# Patient Record
Sex: Female | Born: 1980 | Race: Black or African American | Hispanic: No | Marital: Single | Smoking: Current every day smoker
Health system: Southern US, Community
[De-identification: ages and names within clinical notes are randomized; demographics above are authoritative.]

## PROBLEM LIST (undated history)

## (undated) DIAGNOSIS — I10 Essential (primary) hypertension: Secondary | ICD-10-CM

## (undated) HISTORY — DX: Essential (primary) hypertension: I10

---

## 2001-12-13 ENCOUNTER — Other Ambulatory Visit: Admission: RE | Admit: 2001-12-13 | Discharge: 2001-12-13 | Payer: Self-pay | Admitting: Obstetrics and Gynecology

## 2002-02-07 ENCOUNTER — Inpatient Hospital Stay (HOSPITAL_COMMUNITY): Admission: AD | Admit: 2002-02-07 | Discharge: 2002-02-09 | Payer: Self-pay | Admitting: Obstetrics and Gynecology

## 2009-08-16 ENCOUNTER — Emergency Department (HOSPITAL_COMMUNITY): Admission: EM | Admit: 2009-08-16 | Discharge: 2009-08-16 | Payer: Self-pay | Admitting: Emergency Medicine

## 2010-12-22 LAB — URINALYSIS, ROUTINE W REFLEX MICROSCOPIC
Ketones, ur: NEGATIVE mg/dL
Leukocytes, UA: NEGATIVE
Nitrite: NEGATIVE
Protein, ur: NEGATIVE mg/dL
Urobilinogen, UA: 0.2 mg/dL (ref 0.0–1.0)
pH: 6 (ref 5.0–8.0)

## 2010-12-22 LAB — PREGNANCY, URINE: Preg Test, Ur: NEGATIVE

## 2011-02-04 NOTE — Op Note (Signed)
[facility]  Patient:    Ann Zuniga, Ann Zuniga Visit Number: [id number] MRN: [id number]          Service Type: OBS Location: 4A A416 01 Attending Physician:  Tilda Burrow Dictated by:   Pat Kocher, CNM Proc. Date: 02/07/02 Admit Date:  02/07/2002 Discharge Date: 02/09/2002   CC:         [facility]  J. Joneen Caraway, M.D.   Operative Report  DELIVERY NOTE  [name] became fully dilated with an urge to push at approximately 12:00. She pushed steadily and effectively and delivered a viable female infant at 12:49. Mouth and nose were suctioned on the perineum. Respirations were spontaneous with a lusty cry. Apgar 8 and 9, weight 4 pounds, 3 ounces. Of note, there is a severe amount of molding of the fetal head leading me the believe that [name] would not be adequate for a full-term size infant in the future. The 20 units of Pitocin diluted in a 1000 cc of lactated Ringers was begun infusing rapidly after a large gush of blood and several fist-size clots with partial separation of the placenta. The placenta then completed separation and delivered spontaneously approximately 5 minutes later. The placenta was inspected and appears to be intact with a succenturiate lobe. It is being sent to pathology. The uterus became atonic and required bimanual compression to firm it back up. The patient received 125 mcg of Hemabate IM with good response. However, there were noted to be several large blood clots in the lower uterine segment which were removed manually after the patient received a 1/2 mg of Stadol IV. She tolerated the procedure well. The fundus is now quite firm with minimal blood loss noted. The vagina was inspected and is noted to be intact. Immediate postpartum blood pressure was 172/96 with a normal pulse. [name] will be watched closely regarding her blood pressure. Estimated blood loss 450 cc. Dictated by:   Pat Kocher, CNM Attending Physician:  Tilda Burrow DD:  02/07/02 TD:  02/10/02 Job: [id number]

## 2011-02-04 NOTE — H&P (Signed)
[facility]  Patient:    Ann Zuniga, Ann Zuniga Visit Number: [id number] MRN: [id number]          Service Type: OBS Location: 4A A416 01 Attending Physician:  Tilda Burrow Dictated by:   Estelle June, C.N.M. Admit Date:  02/07/2002                           History and Physical  CHIEF COMPLAINT:  Contractions beginning at 7 a.m., increasing dramatically in frequency and intensity over the past 30 minutes.  HISTORY OF PRESENT ILLNESS:  Ann Zuniga is a 30 year old black female who began prenatal care late at approximately 27 weeks.  Her last menstrual period, April 26, 2001, placed her [facility] at Jan 31, 2002, but her [facility] was corrected with an ultrasound that showed her to be 28-5/7 weeks on December 20, 2001, placing her [facility] at March 09, 2002, placing her at 35 weeks and 2 days gestation.  She has had a total of three visits to the [facility] with the last two visits unkept.  The patient states that she smokes marijuana in order to help her appetite, which apparently it did, saying that she had a documented 18-pound weight gain in three weeks.  Fundal height measurements were normal for gestational age.  PRENATAL LABORATORY DATA:  Blood type O+.  Rubella immune.  The H&H was 10.9 and 33.4.  HIV is nonreactive.  The Pap smear was normal.  Gonorrhea, chlamydia, and group B streptococcus were negative at 27 weeks.  The urine drug screen was positive for marijuana.  PAST MEDICAL HISTORY:  Noncontributory.  PAST SURGICAL HISTORY:  Noncontributory.  No history of blood transfusions or anesthesia complications.  ALLERGIES:  No known drug allergies.  MEDICATIONS:  Iron and prenatal vitamins.  SOCIAL HISTORY:  Single and employed.  High school dropout.  Not using birth control.  The father of the baby, Richard Holz, is present and semi-supportive.  She smokes about a third of a pack of cigarettes a day and admits to marijuana use as stated  above.  GYNECOLOGICAL HISTORY:  The patient is a gravida 1, para 0.  FAMILY HISTORY:  Positive for hypertension, cancer, tuberculosis many years ago, and diabetes.  PHYSICAL EXAMINATION:  HEENT within normal limits.  HEART:  Regular rate and rhythm without murmurs.  LUNGS:  Clear to auscultation bilaterally.  BREASTS:  Exam deferred.  ABDOMEN:  Soft and nontender, having strong contractions every one to one-and-a-half minutes.  The fetal heart rate is in the 120s-130s with average long-term variability.  Accelerations present.  Occasional variable decelerations.  PELVIC:  On 45-minute scope per R.N. exam, the cervix was 3 cm, 100% effaced, and 0 station.  Now she is 8-9, +1 station, bulging membranes, and 100% effaced.  Artificial rupture of membranes reveals clear fluids.  EXTREMITIES:  The legs are negative.  IMPRESSION:  Intrauterine pregnancy at 36 weeks and 2 days in active labor with reassuring fetal heart rate.  PLAN:  Ampicillin for GBS prophylaxis due to preterm labor despite a negative culture eight weeks ago.  Duane Lope, M.D., is aware of the patient and is present on the maternity unit.  Expect vaginal delivery. Dictated by:   Estelle June, C.N.M. Attending Physician:  Tilda Burrow DD:  02/07/02 TD:  02/07/02 Job: [id number]

## 2011-03-27 ENCOUNTER — Emergency Department (HOSPITAL_COMMUNITY)
Admission: EM | Admit: 2011-03-27 | Discharge: 2011-03-27 | Disposition: A | Discharge status: Home / Self Care | Payer: Self-pay | Attending: Emergency Medicine | Admitting: Emergency Medicine

## 2011-03-27 ENCOUNTER — Other Ambulatory Visit: Payer: Self-pay

## 2011-03-27 ENCOUNTER — Encounter: Payer: Self-pay | Admitting: *Deleted

## 2011-03-27 DIAGNOSIS — K219 Gastro-esophageal reflux disease without esophagitis: Secondary | ICD-10-CM | POA: Insufficient documentation

## 2011-03-27 MED ORDER — FAMOTIDINE 20 MG PO TABS: 20.0000 mg | ORAL_TABLET | Freq: Two times a day (BID) | ORAL | Status: DC

## 2011-03-27 MED ORDER — GI COCKTAIL ~~LOC~~
30.0000 mL | Freq: Once | ORAL | Status: AC
  Administered 2011-03-27: 30 mL via ORAL
  Filled 2011-03-27: qty 30

## 2011-03-27 NOTE — ED Notes (Signed)
Gi cocktail given. Nad. Rating pain 6 prior to med.

## 2011-03-27 NOTE — ED Notes (Signed)
States feels like a "ball is in my chest" with burning, difficulty swallowing x 3 days.  Denies n/v.

## 2011-03-27 NOTE — ED Notes (Signed)
No change. Awaiting d/c papers. nad

## 2011-03-27 NOTE — ED Notes (Addendum)
Pt states pain is gone now. Nad. Pa aware

## 2011-03-27 NOTE — ED Provider Notes (Signed)
History     Chief Complaint  Patient presents with  . Gastrophageal Reflux   HPI Comments: Patient c/o burning sensation to her chest since yesterday.  Symptoms worsen with drinking or eating.   HAs hx of similarr sx's  Patient is a 30 y.o. female presenting with GERD. The history is provided by the patient.  Gastrophageal Reflux This is a recurrent problem. The current episode started today. The problem occurs constantly. The problem has been unchanged. Associated symptoms include chest pain. Pertinent negatives include no abdominal pain, coughing, fever, nausea, neck pain or vomiting. Associated symptoms comments: Burning . The symptoms are aggravated by swallowing, eating and drinking. She has tried nothing for the symptoms. The treatment provided no relief.    History reviewed. No pertinent past medical history.  History reviewed. No pertinent past surgical history.  History reviewed. No pertinent family history.  History  Substance Use Topics  . Smoking status: Never Smoker   . Smokeless tobacco: Not on file  . Alcohol Use: No    OB History    Grav Para Term Preterm Abortions TAB SAB Ect Mult Living                  Review of Systems  Constitutional: Negative.  Negative for fever.  HENT: Negative.  Negative for neck pain.   Respiratory: Negative.  Negative for cough, chest tightness and shortness of breath.   Cardiovascular: Positive for chest pain.  Gastrointestinal: Negative for nausea, vomiting, abdominal pain and abdominal distention.  Musculoskeletal: Negative.   Skin: Negative.   Neurological: Negative.     Physical Exam  BP 147/92  Pulse 65  Temp(Src) 97.8 F (36.6 C) (Oral)  Resp 18  Ht 5\' 3"  (1.6 m)  Wt 175 lb (79.379 kg)  BMI 31.00 kg/m2  SpO2 98%  LMP 03/21/2011  Physical Exam  Constitutional: She is oriented to person, place, and time. Vital signs are normal. She appears well-developed and well-nourished.  Non-toxic appearance.  Eyes: EOM are  normal. Pupils are equal, round, and reactive to light.  Neck: Normal range of motion. Neck supple.  Cardiovascular: Normal rate, regular rhythm and normal heart sounds.   Pulmonary/Chest: Breath sounds normal.  Abdominal: Soft. She exhibits no distension. There is no tenderness. There is no rebound and no guarding.  Musculoskeletal: Normal range of motion.  Neurological: She is alert and oriented to person, place, and time.  Skin: Skin is warm and dry.  Psychiatric: She has a normal mood and affect.    ED Course  Procedures  MDM   Date: 03/07/2011   Rate: 49  Rhythm: sinus bradycardia  QRS Axis: normal  Intervals: normal  ST/T Wave abnormalities: early repolarization  Conduction Disutrbances:none  Narrative Interpretation:   Old EKG Reviewed: none available    PAtient feeling better.  Symptoms are improved.    Tammy L. [place], [place] 03/27/11 1403  Evaluation and management were performed by NP/PA under my supervision/collaboration.  SHELDON,CHARLES B.

## 2018-03-05 ENCOUNTER — Ambulatory Visit: Payer: Self-pay | Admitting: Physician Assistant

## 2018-03-13 ENCOUNTER — Encounter: Payer: Self-pay | Admitting: Physician Assistant

## 2018-06-13 ENCOUNTER — Emergency Department (HOSPITAL_COMMUNITY): Payer: Self-pay

## 2018-06-13 ENCOUNTER — Other Ambulatory Visit: Payer: Self-pay

## 2018-06-13 ENCOUNTER — Encounter (HOSPITAL_COMMUNITY): Payer: Self-pay

## 2018-06-13 ENCOUNTER — Emergency Department (HOSPITAL_COMMUNITY)
Admission: EM | Admit: 2018-06-13 | Discharge: 2018-06-13 | Disposition: A | Discharge status: Home / Self Care | Payer: Self-pay | Attending: Emergency Medicine | Admitting: Emergency Medicine

## 2018-06-13 DIAGNOSIS — I10 Essential (primary) hypertension: Secondary | ICD-10-CM | POA: Insufficient documentation

## 2018-06-13 DIAGNOSIS — H6121 Impacted cerumen, right ear: Secondary | ICD-10-CM | POA: Insufficient documentation

## 2018-06-13 DIAGNOSIS — J069 Acute upper respiratory infection, unspecified: Secondary | ICD-10-CM | POA: Insufficient documentation

## 2018-06-13 MED ORDER — ALBUTEROL SULFATE HFA 108 (90 BASE) MCG/ACT IN AERS
2.0000 | INHALATION_SPRAY | Freq: Four times a day (QID) | RESPIRATORY_TRACT | Status: DC
  Administered 2018-06-13: 2 via RESPIRATORY_TRACT
  Filled 2018-06-13: qty 6.7

## 2018-06-13 MED ORDER — BENZONATATE 100 MG PO CAPS: 100.0000 mg | ORAL_CAPSULE | Freq: Three times a day (TID) | ORAL | 0 refills | Status: DC

## 2018-06-13 MED ORDER — CARBAMIDE PEROXIDE 6.5 % OT SOLN: 5.0000 [drp] | Freq: Two times a day (BID) | OTIC | 0 refills | Status: AC

## 2018-06-13 MED ORDER — FLUTICASONE PROPIONATE 50 MCG/ACT NA SUSP: 2.0000 | Freq: Every day | NASAL | 0 refills | Status: DC

## 2018-06-13 MED ORDER — GUAIFENESIN ER 600 MG PO TB12: 600.0000 mg | ORAL_TABLET | Freq: Two times a day (BID) | ORAL | 0 refills | Status: DC

## 2018-06-13 NOTE — ED Provider Notes (Signed)
[facility] EMERGENCY DEPARTMENT Provider Note   CSN: [id number] Arrival date & time: 06/13/18  1007     History   Chief Complaint Chief Complaint  Patient presents with  . Cough    HPI Ann Zuniga is a 37 y.o. female.  HPI   37 year old female presented emergency department today for evaluation of rhinorrhea, nasal congestion, sinus pressure, sore throat, cough.  Denies chest pain or shortness of breath.  Denies exacerbating or alleviating factors.  Has not tried any medications for her symptoms.  Denies fevers or chills.  States she is tobacco.  History reviewed. No pertinent past medical history.  There are no active problems to display for this patient.   History reviewed. No pertinent surgical history.   OB History   None      Home Medications    Prior to Admission medications   Medication Sig Start Date End Date Taking? Authorizing Provider  benzonatate (TESSALON) 100 MG capsule Take 1 capsule (100 mg total) by mouth every 8 (eight) hours. 06/13/18   Jalayiah Bibian S, PA-C  carbamide peroxide (DEBROX) 6.5 % OTIC solution Place 5 drops into the right ear 2 (two) times daily for 4 days. 06/13/18 06/17/18  Raysa Bosak S, PA-C  fluticasone (FLONASE) 50 MCG/ACT nasal spray Place 2 sprays into both nostrils daily. 06/13/18   Mendel Binsfeld S, PA-C  guaiFENesin (MUCINEX) 600 MG 12 hr tablet Take 1 tablet (600 mg total) by mouth 2 (two) times daily. 06/13/18   Kinsie Belford S, PA-C    Family History No family history on file.  Social History Social History   Tobacco Use  . Smoking status: Never Smoker  . Smokeless tobacco: Never Used  Substance Use Topics  . Alcohol use: Never    Frequency: Never  . Drug use: Never     Allergies   Bee venom   Review of Systems Review of Systems  Constitutional: Negative for fever.  HENT: Positive for congestion, ear pain, postnasal drip, rhinorrhea, sinus pain and sore throat.   Eyes: Negative for visual  disturbance.  Respiratory: Negative for shortness of breath.   Cardiovascular: Negative for chest pain.  Gastrointestinal: Negative for abdominal pain, nausea and vomiting.  Genitourinary: Negative for pelvic pain.  Musculoskeletal: Negative for back pain and myalgias.  Neurological: Negative for headaches.    Physical Exam Updated Vital Signs BP (!) 183/107 (BP Location: Left Arm)   Pulse 82   Temp 98.6 F (37 C) (Oral)   Resp 18   Ht 5\' 3"  (1.6 m)   Wt 83 kg   LMP 05/30/2018   SpO2 96%   BMI 32.42 kg/m   Physical Exam  Constitutional: She appears well-developed and well-nourished. No distress.  HENT:  Head: Normocephalic and atraumatic.  Bilateral TMs without erythema or effusion.  Nasal turbinates swollen bilaterally.  No pharyngeal erythema, tonsillar swelling or exudates.  Moist membranes.  Eyes: Pupils are equal, round, and reactive to light. Conjunctivae and EOM are normal.  Neck: Neck supple.  Cardiovascular: Normal rate, regular rhythm and normal heart sounds.  No murmur heard. Pulmonary/Chest: Effort normal. No stridor. No respiratory distress. She has wheezes. She has no rales.  Abdominal: Soft. Bowel sounds are normal. She exhibits no distension. There is no tenderness. There is no guarding.  Musculoskeletal: She exhibits no edema.  Lymphadenopathy:    She has no cervical adenopathy.  Neurological: She is alert.  Skin: Skin is warm and dry.  Psychiatric: She has a normal mood and  affect.  Nursing note and vitals reviewed.   ED Treatments / Results  Labs (all labs ordered are listed, but only abnormal results are displayed) Labs Reviewed - No data to display  EKG None  Radiology Dg Chest 2 View  Result Date: 06/13/2018 CLINICAL DATA:  Cough and short of breath for 1 day EXAM: CHEST - 2 VIEW COMPARISON:  None. FINDINGS: Normal heart size. Clear lungs. No pneumothorax or pleural effusion. Mild dextroscoliosis of the midthoracic spine. IMPRESSION: No  active cardiopulmonary disease.  Thoracic scoliosis. Electronically Signed   By: Jolaine Click M.D.   On: 06/13/2018 11:05    Procedures Procedures (including critical care time)  Medications Ordered in ED Medications  albuterol (PROVENTIL HFA;VENTOLIN HFA) 108 (90 Base) MCG/ACT inhaler 2 puff (has no administration in time range)     Initial Impression / Assessment and Plan / ED Course  I have reviewed the triage vital signs and the nursing notes.  Pertinent labs & imaging results that were available during my care of the patient were reviewed by me and considered in my medical decision making (see chart for details).     Final Clinical Impressions(s) / ED Diagnoses   Final diagnoses:  Hypertension, unspecified type  Upper respiratory tract infection, unspecified type  Impacted cerumen of right ear   Pt CXR negative for acute infiltrate. Patients symptoms are consistent with URI, likely viral etiology. Discussed that antibiotics are not indicated for viral infections. Pt will be discharged with symptomatic treatment.  Verbalizes understanding and is agreeable with plan. Pt is hemodynamically stable & in NAD prior to dc.  Patient's blood pressure elevated in the emergency department today. Patient denies headache, change in vision, numbness, weakness, chest pain, dyspnea, dizziness, or lightheadedness therefore doubt hypertensive emergency. Discussed elevated blood pressure with the patient and the need for primary care follow up with potential need to initiate or change antihypertensive medications and or for further evaluation. Discussed return precaution signs/symptoms for hypertensive emergency as listed above with the patient. He/she confirmed understanding.     ED Discharge Orders         Ordered    benzonatate (TESSALON) 100 MG capsule  Every 8 hours     06/13/18 1244    carbamide peroxide (DEBROX) 6.5 % OTIC solution  2 times daily     06/13/18 1244    fluticasone  (FLONASE) 50 MCG/ACT nasal spray  Daily     06/13/18 1244    guaiFENesin (MUCINEX) 600 MG 12 hr tablet  2 times daily     06/13/18 1244           Gaile Allmon S, PA-C 06/13/18 1248    Benjiman Core, MD 06/13/18 [phone]

## 2018-06-13 NOTE — Discharge Instructions (Addendum)
If you were given a prescription, please take the prescription as you were instructed and follow the directions given on the discharge paperwork.  ° °Over the next several days you should rest as much as possible, and drink more fluids than usual. Liquids will help thin and loosen mucus so you can cough it up. Liquids will also help prevent dehydration. Using a cool mist humidifier or a vaporizer to increase air moisture in your home can also make it easier for you to breathe and help decrease your cough. ° °To help soothe a sore throat gargle with warm salt water.  Make salt water by dissolving ¼ teaspoon salt in 1 cup warm water. You may also use throat lozenges and over the counter sore throat spray. ° °Please follow up with your primary care provider within 5-7 days for re-evaluation of your symptoms. If you do not have a primary care provider, information for a healthcare clinic has been provided for you to make arrangements for follow up care. Please return to the emergency department for any persistent fevers, worsening sore throat/hoarse voice, inability to swallow, persistent vomiting, chest pain, shortness of breath, coughing up blood, or any new or worsening symptoms. ° °You were noted to have high blood pressure today during your visit in the emergency department. You will need to follow up with your primary healthcare provider to have your blood pressure rechecked as you may need to be started on medication for this if it remains elevated. If you experience any chest pain, shortness of breath, headaches, vision changes, numbness, weakness, lightheadedness, changes in mental status, or decrease in urination you should return to the emergency department immediately.  ° ° °

## 2018-06-13 NOTE — ED Triage Notes (Addendum)
Pt reports chills and cough since yesterday.  Denies fever.    Reports 1 episode of diarrhea today.  No n/v.

## 2018-06-14 ENCOUNTER — Encounter: Payer: Self-pay | Admitting: *Deleted

## 2018-09-08 ENCOUNTER — Emergency Department (HOSPITAL_COMMUNITY): Payer: Self-pay

## 2018-09-08 ENCOUNTER — Emergency Department (HOSPITAL_COMMUNITY)
Admission: EM | Admit: 2018-09-08 | Discharge: 2018-09-08 | Disposition: A | Discharge status: Home / Self Care | Payer: Self-pay | Attending: Emergency Medicine | Admitting: Emergency Medicine

## 2018-09-08 ENCOUNTER — Encounter (HOSPITAL_COMMUNITY): Payer: Self-pay | Admitting: Emergency Medicine

## 2018-09-08 DIAGNOSIS — I10 Essential (primary) hypertension: Secondary | ICD-10-CM | POA: Insufficient documentation

## 2018-09-08 DIAGNOSIS — J069 Acute upper respiratory infection, unspecified: Secondary | ICD-10-CM | POA: Insufficient documentation

## 2018-09-08 MED ORDER — LORATADINE 10 MG PO TABS: 10.0000 mg | ORAL_TABLET | Freq: Every day | ORAL | 0 refills | Status: DC

## 2018-09-08 MED ORDER — FLUTICASONE PROPIONATE 50 MCG/ACT NA SUSP: 2.0000 | Freq: Every day | NASAL | 0 refills | Status: DC

## 2018-09-08 MED ORDER — AMLODIPINE BESYLATE 5 MG PO TABS
5.0000 mg | ORAL_TABLET | Freq: Once | ORAL | Status: AC
  Administered 2018-09-08: 5 mg via ORAL
  Filled 2018-09-08: qty 1

## 2018-09-08 MED ORDER — AMLODIPINE BESYLATE 5 MG PO TABS: 5.0000 mg | ORAL_TABLET | Freq: Every day | ORAL | 1 refills | Status: DC

## 2018-09-08 NOTE — ED Provider Notes (Signed)
[facility] EMERGENCY DEPARTMENT Provider Note   CSN: [id number] Arrival date & time: 09/08/18  [id number]     History   Chief Complaint Chief Complaint  Patient presents with  . Nasal Congestion    HPI Ann Zuniga is a 37 y.o. female.  HPI  37 year old female presents with cough and congestion.  Has been ongoing for about a week, possibly up to 2 weeks.  She states the cough is nonproductive but she feels like there is a lot of chest congestion.  She also has nasal congestion and feeling like she is overall stuffed up.  She is tried ibuprofen and Tylenol cold medicine.  She denies any fevers but sometimes feels some aches.  She denies any shortness of breath though it is hard to breathe through her nose.  No chest pain or headache.  She is noted to be hypertensive today.  She states that she is been told there is a strong family history of hypertension.  She herself does not know if she has hypertension.  History reviewed. No pertinent past medical history.  There are no active problems to display for this patient.   History reviewed. No pertinent surgical history.   OB History   No obstetric history on file.      Home Medications    Prior to Admission medications   Medication Sig Start Date End Date Taking? Authorizing Provider  amLODipine (NORVASC) 5 MG tablet Take 1 tablet (5 mg total) by mouth daily. 09/08/18   Pricilla LovelessGoldston, Matheson Vandehei, MD  benzonatate (TESSALON) 100 MG capsule Take 1 capsule (100 mg total) by mouth every 8 (eight) hours. 06/13/18   Couture, Cortni S, PA-C  famotidine (PEPCID) 20 MG tablet Take 1 tablet (20 mg total) by mouth 2 (two) times daily. 03/27/11 03/26/12  Triplett, Tammy, PA-C  fluticasone (FLONASE) 50 MCG/ACT nasal spray Place 2 sprays into both nostrils daily. 09/08/18   Pricilla LovelessGoldston, Mianna Iezzi, MD  guaiFENesin (MUCINEX) 600 MG 12 hr tablet Take 1 tablet (600 mg total) by mouth 2 (two) times daily. 06/13/18   Couture, Cortni S, PA-C  loratadine (CLARITIN) 10  MG tablet Take 1 tablet (10 mg total) by mouth daily. 09/08/18   Pricilla LovelessGoldston, Lyrique Hakim, MD    Family History History reviewed. No pertinent family history.  Social History Social History   Tobacco Use  . Smoking status: Never Smoker  . Smokeless tobacco: Never Used  Substance Use Topics  . Alcohol use: Never    Frequency: Never  . Drug use: Never     Allergies   Bee venom   Review of Systems Review of Systems  Constitutional: Negative for fever.  HENT: Positive for congestion and sinus pressure. Negative for ear pain and sore throat.   Respiratory: Positive for cough. Negative for shortness of breath.   Cardiovascular: Negative for chest pain.  Gastrointestinal: Negative for vomiting.  Neurological: Negative for headaches.  All other systems reviewed and are negative.    Physical Exam Updated Vital Signs BP (!) 162/127 (BP Location: Right Arm) Comment: will not stop moving arm after I asked her multiple times to relax it  Pulse 93   Temp 98.2 F (36.8 C) (Oral)   Resp 15   Ht 5\' 4"  (1.626 m)   Wt 84.8 kg   LMP 08/03/2018   SpO2 98%   BMI 32.10 kg/m   Physical Exam Vitals signs and nursing note reviewed.  Constitutional:      General: She is not in acute distress.  Appearance: She is well-developed. She is not ill-appearing or toxic-appearing.  HENT:     Head: Normocephalic and atraumatic.     Right Ear: External ear normal.     Left Ear: External ear normal.     Nose: Congestion present. No rhinorrhea.     Mouth/Throat:     Pharynx: No oropharyngeal exudate or posterior oropharyngeal erythema.  Eyes:     General:        Right eye: No discharge.        Left eye: No discharge.  Cardiovascular:     Rate and Rhythm: Normal rate and regular rhythm.     Heart sounds: Normal heart sounds.  Pulmonary:     Effort: Pulmonary effort is normal.     Breath sounds: Normal breath sounds.  Abdominal:     Palpations: Abdomen is soft.     Tenderness: There is no  abdominal tenderness.  Skin:    General: Skin is warm and dry.  Neurological:     Mental Status: She is alert.  Psychiatric:        Mood and Affect: Mood is not anxious.      ED Treatments / Results  Labs (all labs ordered are listed, but only abnormal results are displayed) Labs Reviewed - No data to display  EKG None  Radiology Dg Chest 2 View  Result Date: 09/08/2018 CLINICAL DATA:  Cough and chest congestion for 1 week. EXAM: CHEST - 2 VIEW COMPARISON:  None. FINDINGS: The heart size and mediastinal contours are within normal limits. Both lungs are clear. Mild thoracic dextroscoliosis again noted. IMPRESSION: No active cardiopulmonary disease. Electronically Signed   By: Myles RosenthalJohn  Stahl M.D.   On: 09/08/2018 08:50    Procedures Procedures (including critical care time)  Medications Ordered in ED Medications  amLODipine (NORVASC) tablet 5 mg (5 mg Oral Given 09/08/18 0804)     Initial Impression / Assessment and Plan / ED Course  I have reviewed the triage vital signs and the nursing notes.  Pertinent labs & imaging results that were available during my care of the patient were reviewed by me and considered in my medical decision making (see chart for details).     Patient symptoms are most consistent with a viral URI.  Chest x-ray is clear.  She has noted to be pretty hypertensive and on chart review from a couple months ago was also quite hypertensive.  She does not have a PCP.  She does not have any signs or symptoms of endorgan damage but I think she does need to start on blood pressure medicine given the degree and multiple abnormal measurements.  She will be started on amlodipine and I stressed the importance of following up with the PCP.  We discussed return precautions.  Final Clinical Impressions(s) / ED Diagnoses   Final diagnoses:  Upper respiratory tract infection, unspecified type  Essential hypertension    ED Discharge Orders         Ordered     amLODipine (NORVASC) 5 MG tablet  Daily     09/08/18 0906    loratadine (CLARITIN) 10 MG tablet  Daily     09/08/18 0906    fluticasone (FLONASE) 50 MCG/ACT nasal spray  Daily     09/08/18 0906           Pricilla LovelessGoldston, Cesario Weidinger, MD 09/08/18 [phone]

## 2018-09-08 NOTE — Discharge Instructions (Signed)
It is very important to follow up with a primary care physician for your blood pressure. If you develop severe headache, blurry vision, chest pain, shortness of breath, weakness or numbness, or any other new/concerning symptoms then return to the ER for evaluation.

## 2018-09-08 NOTE — ED Triage Notes (Signed)
Pt states she has had a lot of nasal congestion for a week.  Not really coughing much.  Has been taking tylenol, but nothing else.

## 2019-01-05 ENCOUNTER — Other Ambulatory Visit: Payer: Self-pay

## 2019-01-05 ENCOUNTER — Emergency Department (HOSPITAL_COMMUNITY)
Admission: EM | Admit: 2019-01-05 | Discharge: 2019-01-05 | Disposition: A | Discharge status: Home / Self Care | Payer: Self-pay | Attending: Emergency Medicine | Admitting: Emergency Medicine

## 2019-01-05 ENCOUNTER — Encounter (HOSPITAL_COMMUNITY): Payer: Self-pay

## 2019-01-05 DIAGNOSIS — R519 Headache, unspecified: Secondary | ICD-10-CM

## 2019-01-05 DIAGNOSIS — Z79899 Other long term (current) drug therapy: Secondary | ICD-10-CM | POA: Insufficient documentation

## 2019-01-05 DIAGNOSIS — R51 Headache: Secondary | ICD-10-CM | POA: Insufficient documentation

## 2019-01-05 DIAGNOSIS — F17210 Nicotine dependence, cigarettes, uncomplicated: Secondary | ICD-10-CM | POA: Insufficient documentation

## 2019-01-05 NOTE — ED Triage Notes (Signed)
Pt is here to be seen for off and on headaches. Last time she had a headache was yesterday and lasted 3 hours. No headache at this time. Pt does not take meds for HA.

## 2019-01-05 NOTE — ED Provider Notes (Signed)
[facility] EMERGENCY DEPARTMENT Provider Note   CSN: [id number] Arrival date & time: 01/05/19  1221    History   Chief Complaint Chief Complaint  Patient presents with  . Headache    HPI Ann Zuniga is a 38 y.o. female.     Pt presents to the ED today with a headache.  The pt said she has been under a lot of stress with Covid and worried that her job will close and how she's going to pay the bills.  She had a headache at work, and her work made her come here to get a note saying she could work.  She has no resp sx.  She has no URI sx.  No n/v/d.  No headache now.  She has not been sleeping due to worry, and her headache goes away with Alleve.       History reviewed. No pertinent past medical history.  There are no active problems to display for this patient.   History reviewed. No pertinent surgical history.   OB History   No obstetric history on file.      Home Medications    Prior to Admission medications   Medication Sig Start Date End Date Taking? Authorizing Provider  amLODipine (NORVASC) 5 MG tablet Take 1 tablet (5 mg total) by mouth daily. 09/08/18   Pricilla LovelessGoldston, Scott, MD  benzonatate (TESSALON) 100 MG capsule Take 1 capsule (100 mg total) by mouth every 8 (eight) hours. 06/13/18   Couture, Cortni S, PA-C  famotidine (PEPCID) 20 MG tablet Take 1 tablet (20 mg total) by mouth 2 (two) times daily. 03/27/11 03/26/12  Triplett, Tammy, PA-C  fluticasone (FLONASE) 50 MCG/ACT nasal spray Place 2 sprays into both nostrils daily. 09/08/18   Pricilla LovelessGoldston, Scott, MD  guaiFENesin (MUCINEX) 600 MG 12 hr tablet Take 1 tablet (600 mg total) by mouth 2 (two) times daily. 06/13/18   Couture, Cortni S, PA-C  loratadine (CLARITIN) 10 MG tablet Take 1 tablet (10 mg total) by mouth daily. 09/08/18   Pricilla LovelessGoldston, Scott, MD    Family History No family history on file.  Social History Social History   Tobacco Use  . Smoking status: Current Every Day Smoker    Packs/day: 1.00  .  Smokeless tobacco: Never Used  Substance Use Topics  . Alcohol use: Yes    Frequency: Never    Comment: occ  . Drug use: Yes    Types: Marijuana     Allergies   Bee venom   Review of Systems Review of Systems  Neurological: Positive for headaches.  All other systems reviewed and are negative.    Physical Exam Updated Vital Signs BP (!) 148/101 (BP Location: Right Arm)   Pulse 82   Temp 98.2 F (36.8 C) (Oral)   Resp 17   Ht 5\' 3"  (1.6 m)   Wt 84.8 kg   LMP 12/02/2018   SpO2 97%   BMI 33.13 kg/m   Physical Exam Vitals signs and nursing note reviewed.  Constitutional:      Appearance: She is well-developed.  HENT:     Head: Normocephalic and atraumatic.     Mouth/Throat:     Mouth: Mucous membranes are moist.  Eyes:     Extraocular Movements: Extraocular movements intact.     Pupils: Pupils are equal, round, and reactive to light.  Neck:     Musculoskeletal: Normal range of motion and neck supple.  Cardiovascular:     Rate and Rhythm: Normal rate and regular  rhythm.  Pulmonary:     Effort: Pulmonary effort is normal.     Breath sounds: Normal breath sounds.  Abdominal:     General: Bowel sounds are normal.     Palpations: Abdomen is soft.  Musculoskeletal: Normal range of motion.  Skin:    General: Skin is warm.     Capillary Refill: Capillary refill takes less than 2 seconds.  Neurological:     Mental Status: She is alert and oriented to person, place, and time.  Psychiatric:        Mood and Affect: Mood normal.        Behavior: Behavior normal.      ED Treatments / Results  Labs (all labs ordered are listed, but only abnormal results are displayed) Labs Reviewed - No data to display  EKG None  Radiology No results found.  Procedures Procedures (including critical care time)  Medications Ordered in ED Medications - No data to display   Initial Impression / Assessment and Plan / ED Course  I have reviewed the triage vital signs and  the nursing notes.  Pertinent labs & imaging results that were available during my care of the patient were reviewed by me and considered in my medical decision making (see chart for details).    Pt's risk of Covid is very low.  I think it is fine for her to go back to work as that will help her with her stress.  She is stable for d/c.   [name] was evaluated in Emergency Department on 01/05/2019 for the symptoms described in the history of present illness. She was evaluated in the context of the global COVID-19 pandemic, which necessitated consideration that the patient might be at risk for infection with the SARS-CoV-2 virus that causes COVID-19. Institutional protocols and algorithms that pertain to the evaluation of patients at risk for COVID-19 are in a state of rapid change based on information released by regulatory bodies including the CDC and federal and state organizations. These policies and algorithms were followed during the patient's care in the ED.  Final Clinical Impressions(s) / ED Diagnoses   Final diagnoses:  Acute nonintractable headache, unspecified headache type    ED Discharge Orders    None       Jacalyn Lefevre, MD 01/05/19 1242

## 2019-10-07 IMAGING — DX DG CHEST 2V
2 series · 2 of 2 positions shown · non-contrast
Comparison: None.

CLINICAL DATA: Cough and chest congestion for 1 week.

EXAM:
CHEST - 2 VIEW

[chest lat]
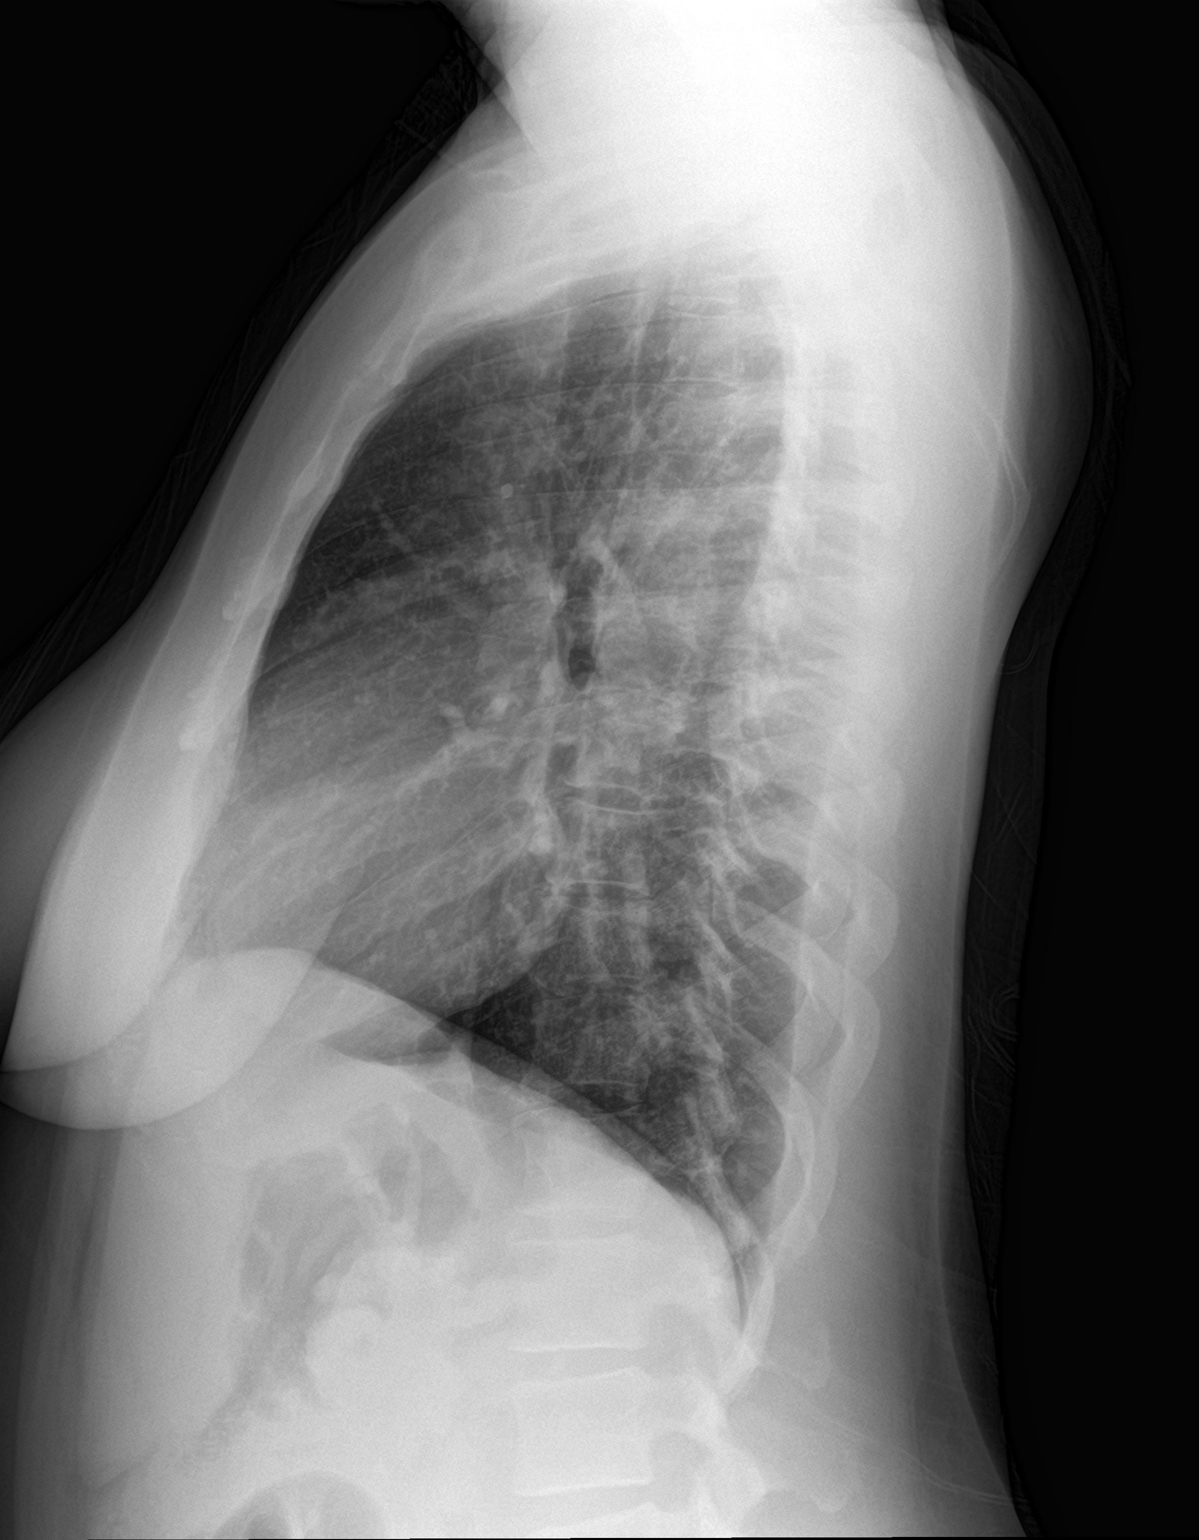

[chest pa]
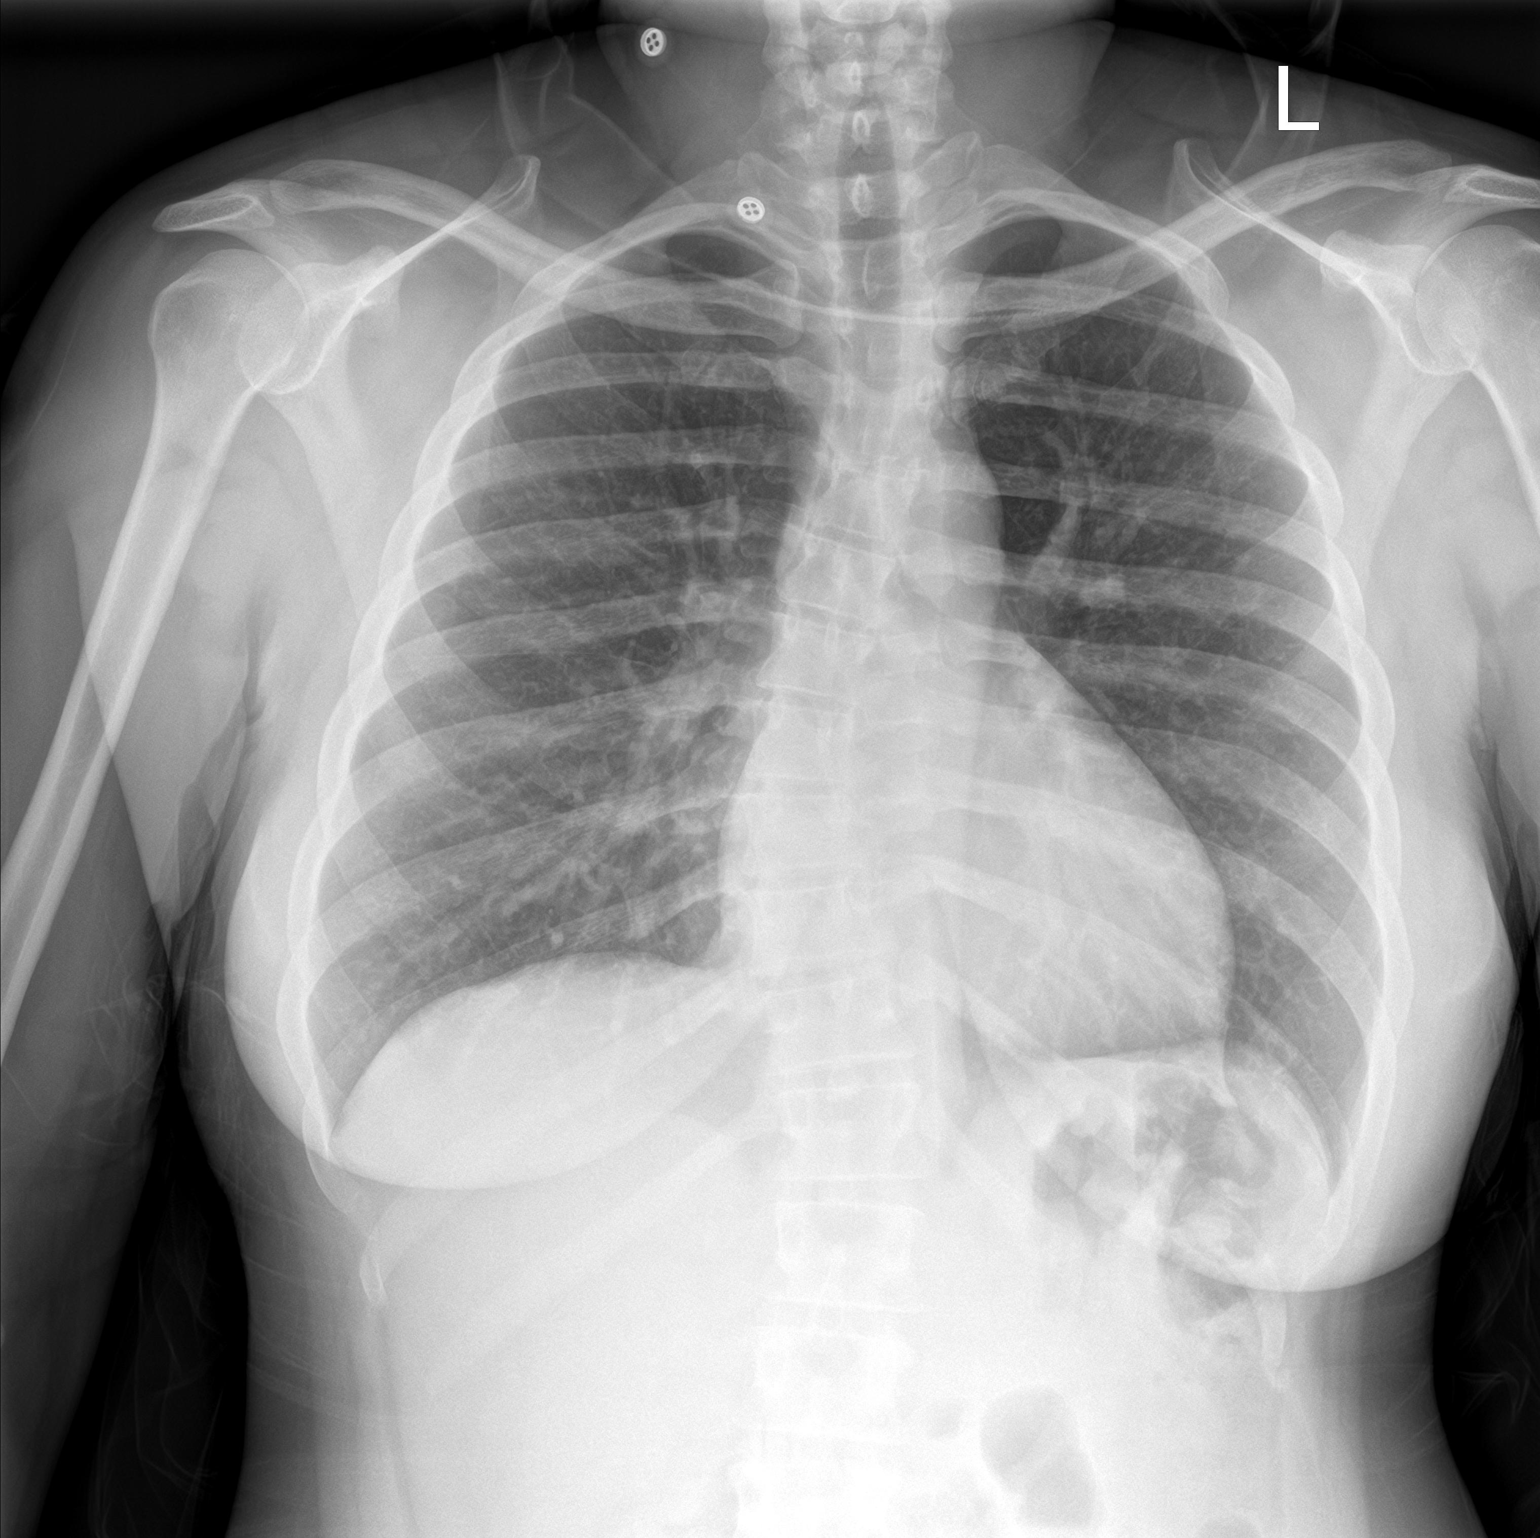

[2 of 2 positions shown; findings below may reference images not displayed]

FINDINGS: The heart size and mediastinal contours are within normal limits.
Both lungs are clear. Mild thoracic dextroscoliosis again noted.
IMPRESSION: No active cardiopulmonary disease.

## 2020-01-27 NOTE — Congregational Nurse Program (Signed)
Dept: [phone]   Congregational Nurse Program Note  Date of Encounter: 01/27/2020  Past Medical History: No past medical history on file.  Encounter Details: CNP Questionnaire - 01/27/20 1516      Questionnaire   Patient Status  Not Applicable    Race  Black or African American    Location Patient Served At  [employer]  Not Applicable    Uninsured  Uninsured (NEW 1x/quarter)    Food  No food insecurities    Housing/Utilities  Yes, have permanent housing    Transportation  No transportation needs    Interpersonal Safety  Yes, feel physically and emotionally safe where you currently live    Medication  Yes, have medication insecurities;Provided medication assistance    Medical Provider  No    Referrals  Primary Care Provider/Clinic;[facility];Medication Assistance    ED Visit Averted  Yes    Life-Saving Intervention Made  Not Applicable       Client in today to enroll in [facility]. She is currently unemployed and while applying for a job was told she is a possible hire, however her blood pressure was elevated and they want her to show she is seeking help and working on getting her blood pressure treated and under control.    Allergies: reported allergy to Bee Venom DOES NOT HAVE EPI PEN  Past medical History ER visit for headache in 2020 found to be hypertensive: prescribed medication that client reports never filling or taking. No other known medical history. Reports not seeing a primary care provider other than ER visit since she was younger.  No Past surgeries per client  No current medications or over the counter medications  Reports smoking 6 cigarettes per day(trying to cut back) Denies today any alcohol Denies today any drug use  Blood pressures Right Arm/sitting/normal cuff  166/107 Left arm 170/107 sitting/normal cuff Pulse 74 Respirations 16 not labored 98 room air saturation Temp 98.7 orally  Alert and  oriented to person, place and time. Answers questions appropriately. Appears to be slightly nervous. Does report increased stress due to unemployment and bills. Denies any history of depression or anxiety. Denies thoughts of suicide and denies past attempts.  Denies chest pains or shortness of breath today. Denies headaches or vision changes, no facial drooping, gait and balance appear steady with ambulation. denies any numbness or tingling or weakness of arms or legs. Speech is not slurred. Client does report that she knows she is not eating right, she is eating salty foods, fried fatty foods such as pork and sodas. Discussed decreasing fried, fatty foods, decreasing sat intake as well as changing sodas for water. Also discussed affects of uncontrolled untreated blood pressure on her body and risks of untreated blood pressure that can result in stroke, and heart attacks. Discussed stroke and heart attack symptoms and to call 911 for emergency treatment for any of chest pain, shortness of breath, sudden vision changes, sudden severe headache, loss of balance, trouble with speech, facial drooping, problems with balance, extremity weakness legs or arms, numbness or tingling. Client states understanding.  Discussed options for today for treatment of blood pressure and establishing primary care.  Plan: [facility] for [facility]04/20 at 0800 appointment card given. Will plan consulting with Dr. Windell Norfolk Medical director for possible medication bridging until seen by primary care.  Discussed with client that another option would be to attempt provider visit with [facility] and  at provider determination to prescribe medications for blood pressure elevation. Client wants to attempt Dr. Joya Gaskins first as she has friend waiting for her to finish. Noted that if Dr. Joya Gaskins is not available we will use [facility] and client is agreeable.  Dr. Asencion Noble called and vital signs  reported, provider will prescribe a bridging medication to be sent to [facility] and will notify RN and what the medications will be.  Discussed with client that once prescriptions are sent to [facility] by Dr Asencion Noble, I will notify her and will send a one time assistance voucher to cover cost. Client states understanding.  Client arranged to come in for blood pressure recheck at [facility] 01/30/20 at 0900. Client given intake forms with recommendations and above plan of care. Stressed symptoms to call 911 with client again. And stressed taking medications as prescribed and to come in for her recheck and keep [facility] to establish care and to have labs checked and blood pressure further evaluated and treated. Client states understanding.  Will notify client when prescriptions are available.  Handout on hypertension given to client.  Debria Garret RN [facility]

## 2020-01-28 ENCOUNTER — Telehealth: Payer: Self-pay

## 2020-01-28 NOTE — Telephone Encounter (Signed)
Client notified that Dr. Shan Levans had sent two prescriptions to [facility] for amlodipine and losartan/hctz. Voucher from [employer] had been faxed to pharmacy for a one time assistance. Client notified to pick up medication and begin that medication today. Client is scheduled to come to [facility] 01/30/20 at 0900 for blood pressure recheck and reminded [facility] appointment is for [facility]/04/20 at 0800.   Francee Nodal Rn

## 2020-02-04 ENCOUNTER — Ambulatory Visit: Payer: Self-pay | Admitting: Physician Assistant

## 2020-02-18 ENCOUNTER — Encounter: Payer: Self-pay | Admitting: Physician Assistant

## 2020-02-18 ENCOUNTER — Other Ambulatory Visit: Payer: Self-pay

## 2020-02-18 ENCOUNTER — Ambulatory Visit: Payer: Self-pay | Admitting: Physician Assistant

## 2020-02-18 VITALS — BP 120/70 | HR 96 | Temp 98.1°F | Ht 62.25 in | Wt 148.5 lb

## 2020-02-18 DIAGNOSIS — Z7689 Persons encountering health services in other specified circumstances: Secondary | ICD-10-CM

## 2020-02-18 DIAGNOSIS — I10 Essential (primary) hypertension: Secondary | ICD-10-CM

## 2020-02-18 MED ORDER — LOSARTAN POTASSIUM-HCTZ 50-12.5 MG PO TABS: 1.0000 | ORAL_TABLET | Freq: Every day | ORAL | 0 refills | Status: DC

## 2020-02-18 MED ORDER — AMLODIPINE BESYLATE 10 MG PO TABS: 10.0000 mg | ORAL_TABLET | Freq: Every day | ORAL | 0 refills | Status: DC

## 2020-02-18 NOTE — Progress Notes (Signed)
BP 120/70   Pulse 96   Temp 98.1 F (36.7 C)   Ht 5' 2.25" (1.581 m)   Wt 148 lb 8 oz (67.4 kg)   SpO2 97%   BMI 26.94 kg/m    Subjective:    Patient ID: Ann Zuniga, female    DOB: 1980-12-02, 39 y.o.   MRN: [id number]  HPI: Ann Zuniga is a 39 y.o. female presenting on 02/18/2020 for New Patient (Initial Visit) (pt has had no PCP. pt is here to establish care and for her HTN)   HPI   Pt had a negative covid 19 screening questionnaire    Pt is a 39yoF who presents to establish care. She Missed her first new pt appointment scheduled for 02/04/20.   She works at [employer] and says she will be getting insurance from there in 10 weeks.    She says she has Never had a mammogram.  Her mother had breast CA.  Her Last pap was at [facility].    She says she is feeling well.  She is having no problems with the medications she was given for HTN.       Relevant past medical, surgical, family and social history reviewed and updated as indicated. Interim medical history since our last visit reviewed. Allergies and medications reviewed and updated.   Current Outpatient Medications:  .  amLODipine (NORVASC) 10 MG tablet, Take 10 mg by mouth daily., Disp: , Rfl:  .  losartan-hydrochlorothiazide (HYZAAR) 50-12.5 MG tablet, Take 1 tablet by mouth daily., Disp: , Rfl:     Review of Systems  Per HPI unless specifically indicated above     Objective:    BP 120/70   Pulse 96   Temp 98.1 F (36.7 C)   Ht 5' 2.25" (1.581 m)   Wt 148 lb 8 oz (67.4 kg)   SpO2 97%   BMI 26.94 kg/m   Wt Readings from Last 3 Encounters:  02/18/20 148 lb 8 oz (67.4 kg)  01/05/19 187 lb (84.8 kg)  09/08/18 187 lb (84.8 kg)    Physical Exam Vitals reviewed.  Constitutional:      General: She is not in acute distress.    Appearance: Normal appearance. She is well-developed. She is not ill-appearing.  HENT:     Head: Normocephalic and atraumatic.     Mouth/Throat:     Pharynx: No  oropharyngeal exudate.  Eyes:     Conjunctiva/sclera: Conjunctivae normal.     Pupils: Pupils are equal, round, and reactive to light.  Neck:     Thyroid: No thyromegaly.  Cardiovascular:     Rate and Rhythm: Normal rate and regular rhythm.  Pulmonary:     Effort: Pulmonary effort is normal.     Breath sounds: Normal breath sounds.  Abdominal:     General: Bowel sounds are normal.     Palpations: Abdomen is soft. There is no mass.     Tenderness: There is no abdominal tenderness.  Musculoskeletal:     Cervical back: Neck supple.     Right lower leg: No edema.     Left lower leg: No edema.  Lymphadenopathy:     Cervical: No cervical adenopathy.  Skin:    General: Skin is warm and dry.  Neurological:     Mental Status: She is alert and oriented to person, place, and time.     Sensory: Sensation is intact.     Motor: No weakness or tremor.     Gait: Gait  is intact. Gait normal.     Deep Tendon Reflexes:     Reflex Scores:      Patellar reflexes are 2+ on the right side and 2+ on the left side. Psychiatric:        Attention and Perception: Attention normal.        Mood and Affect: Mood normal.        Speech: Speech normal.        Behavior: Behavior normal. Behavior is cooperative.           Assessment & Plan:    Encounter Diagnoses  Name Primary?  . Encounter to establish care Yes  . Essential hypertension       -will Check bmp -Record request for most recent PAP sent to [facility] -Pt deferred mammogram and other tests since she will be getting insurance in the near future -pt to follow up 6 wk for recheck bp.  She is to contact office sooner prn

## 2020-03-31 ENCOUNTER — Ambulatory Visit: Payer: Self-pay | Admitting: Physician Assistant

## 2020-03-31 ENCOUNTER — Other Ambulatory Visit: Payer: Self-pay

## 2020-03-31 ENCOUNTER — Encounter: Payer: Self-pay | Admitting: Physician Assistant

## 2020-03-31 ENCOUNTER — Emergency Department (HOSPITAL_COMMUNITY): Admission: EM | Admit: 2020-03-31 | Discharge: 2020-03-31 | Discharge status: Against Medical Advice | Payer: Self-pay

## 2020-03-31 VITALS — BP 104/64 | HR 79 | Temp 98.2°F | Ht 62.25 in | Wt 151.5 lb

## 2020-03-31 DIAGNOSIS — F17200 Nicotine dependence, unspecified, uncomplicated: Secondary | ICD-10-CM

## 2020-03-31 DIAGNOSIS — I10 Essential (primary) hypertension: Secondary | ICD-10-CM

## 2020-03-31 NOTE — Progress Notes (Signed)
BP 104/64    Pulse 79    Temp 98.2 F (36.8 C)    Ht 5' 2.25" (1.581 m)    Wt 151 lb 8 oz (68.7 kg)    SpO2 97%    BMI 27.49 kg/m    Subjective:    Patient ID: Ann Zuniga, female    DOB: June 08, 1981, 40 y.o.   MRN: [id number]  HPI: Ann Zuniga is a 39 y.o. female presenting on 03/31/2020 for Hypertension   HPI    Pt had a negative covid 19 screening questionnaire.   Pt is 39yoF with HTN who established care last month.   She has not yet gotten the labs ordered for her at that time.  Pt opted to defer most screening tests due to she will be getting insurance through her employer in the next 6-8 weeks.   She is feeling well and has no complaints.    Relevant past medical, surgical, family and social history reviewed and updated as indicated. Interim medical history since our last visit reviewed. Allergies and medications reviewed and updated.   Current Outpatient Medications:    amLODipine (NORVASC) 10 MG tablet, Take 1 tablet (10 mg total) by mouth daily., Disp: 90 tablet, Rfl: 0   losartan-hydrochlorothiazide (HYZAAR) 50-12.5 MG tablet, Take 1 tablet by mouth daily., Disp: 90 tablet, Rfl: 0    Review of Systems  Per HPI unless specifically indicated above     Objective:    BP 104/64    Pulse 79    Temp 98.2 F (36.8 C)    Ht 5' 2.25" (1.581 m)    Wt 151 lb 8 oz (68.7 kg)    SpO2 97%    BMI 27.49 kg/m   Wt Readings from Last 3 Encounters:  03/31/20 151 lb 8 oz (68.7 kg)  02/18/20 148 lb 8 oz (67.4 kg)  01/05/19 187 lb (84.8 kg)    Physical Exam Vitals reviewed.  Constitutional:      General: She is not in acute distress.    Appearance: She is well-developed. She is not toxic-appearing.  HENT:     Head: Normocephalic and atraumatic.  Cardiovascular:     Rate and Rhythm: Normal rate and regular rhythm.  Pulmonary:     Effort: Pulmonary effort is normal.     Breath sounds: Normal breath sounds.  Abdominal:     General: Bowel sounds are normal.      Palpations: Abdomen is soft. There is no mass.     Tenderness: There is no abdominal tenderness.  Musculoskeletal:     Cervical back: Neck supple.     Right lower leg: No edema.     Left lower leg: No edema.  Lymphadenopathy:     Cervical: No cervical adenopathy.  Skin:    General: Skin is warm and dry.  Neurological:     Mental Status: She is alert and oriented to person, place, and time.  Psychiatric:        Behavior: Behavior normal.           Assessment & Plan:    Encounter Diagnoses  Name Primary?   Essential hypertension Yes   Tobacco use disorder       -pt to Get bmp drawn today when leaves office.  She will be called with results -pt was Encouraged to get covid vaccination -pt Will have insuance in about 2 months so she will not be scheduled for follow up in 3 months.  Discussed with pt that  she will need to get established with new PCP when her insurance takes effect.  Pt is in agreement.  She knows to contact office if she has the need before the insurance takes effect

## 2020-03-31 NOTE — ED Notes (Signed)
Not available when called for triage

## 2020-12-20 ENCOUNTER — Encounter (HOSPITAL_COMMUNITY): Payer: Self-pay | Admitting: Emergency Medicine

## 2020-12-20 ENCOUNTER — Other Ambulatory Visit: Payer: Self-pay

## 2020-12-20 ENCOUNTER — Emergency Department (HOSPITAL_COMMUNITY)
Admission: EM | Admit: 2020-12-20 | Discharge: 2020-12-20 | Disposition: A | Discharge status: Home / Self Care | Payer: Self-pay | Attending: Emergency Medicine | Admitting: Emergency Medicine

## 2020-12-20 DIAGNOSIS — R519 Headache, unspecified: Secondary | ICD-10-CM

## 2020-12-20 DIAGNOSIS — F17210 Nicotine dependence, cigarettes, uncomplicated: Secondary | ICD-10-CM | POA: Insufficient documentation

## 2020-12-20 DIAGNOSIS — I10 Essential (primary) hypertension: Secondary | ICD-10-CM | POA: Insufficient documentation

## 2020-12-20 DIAGNOSIS — H538 Other visual disturbances: Secondary | ICD-10-CM | POA: Insufficient documentation

## 2020-12-20 DIAGNOSIS — Z760 Encounter for issue of repeat prescription: Secondary | ICD-10-CM | POA: Insufficient documentation

## 2020-12-20 DIAGNOSIS — Z79899 Other long term (current) drug therapy: Secondary | ICD-10-CM | POA: Insufficient documentation

## 2020-12-20 LAB — BASIC METABOLIC PANEL
Anion gap: 9 (ref 5–15)
BUN: 13 mg/dL (ref 6–20)
CO2: 20 mmol/L — ABNORMAL LOW (ref 22–32)
Calcium: 8.9 mg/dL (ref 8.9–10.3)
Chloride: 107 mmol/L (ref 98–111)
Creatinine, Ser: 0.83 mg/dL (ref 0.44–1.00)
GFR, Estimated: >60 mL/min (ref >60)
Glucose, Bld: 87 mg/dL (ref 70–99)
Potassium: 4.4 mmol/L (ref 3.5–5.1)
Sodium: 136 mmol/L (ref 135–145)

## 2020-12-20 LAB — HCG, QUANTITATIVE, PREGNANCY: hCG, Beta Chain, Quant, S: <1 m[IU]/mL (ref <5)

## 2020-12-20 MED ORDER — LOSARTAN POTASSIUM 25 MG PO TABS
50.0000 mg | ORAL_TABLET | Freq: Once | ORAL | Status: AC
  Administered 2020-12-20: 50 mg via ORAL
  Filled 2020-12-20: qty 2

## 2020-12-20 MED ORDER — AMLODIPINE BESYLATE 5 MG PO TABS
10.0000 mg | ORAL_TABLET | Freq: Once | ORAL | Status: AC
  Administered 2020-12-20: 10 mg via ORAL
  Filled 2020-12-20: qty 2

## 2020-12-20 MED ORDER — HYDROCHLOROTHIAZIDE 12.5 MG PO CAPS
12.5000 mg | ORAL_CAPSULE | Freq: Every day | ORAL | Status: DC
  Administered 2020-12-20: 12.5 mg via ORAL
  Filled 2020-12-20: qty 1

## 2020-12-20 MED ORDER — LOSARTAN POTASSIUM-HCTZ 50-12.5 MG PO TABS: 1.0000 | ORAL_TABLET | Freq: Every day | ORAL | 0 refills | Status: DC

## 2020-12-20 MED ORDER — ACETAMINOPHEN 325 MG PO TABS
650.0000 mg | ORAL_TABLET | Freq: Once | ORAL | Status: AC
  Administered 2020-12-20: 650 mg via ORAL
  Filled 2020-12-20: qty 2

## 2020-12-20 MED ORDER — AMLODIPINE BESYLATE 10 MG PO TABS: 10.0000 mg | ORAL_TABLET | Freq: Every day | ORAL | 0 refills | Status: DC

## 2020-12-20 NOTE — ED Triage Notes (Signed)
Pt reports headaches x 2-3 weeks. Pt has been seen by PCP and prescribed "several different bp meds and I ran out of them."

## 2020-12-20 NOTE — ED Provider Notes (Signed)
[facility] EMERGENCY DEPARTMENT Provider Note   CSN: [id number] Arrival date & time: 12/20/20  6294     History Chief Complaint  Patient presents with  . Hypertension    Headache    Ann Zuniga is a 40 y.o. female presenting with intermittent headaches and suspected blood pressure elevation since she ran out of her 2 blood pressure medications 2 weeks ago.  [facility] but lost her coverage last summer when she became employed, but is not yet eligible for insurance.  She reports a mild frontal headache today, denies dizziness, visual changes, focal weakness, however does state when headache is severe, has caused blurred vision, but not currently. Also denies cp, sob, orthopnea, peripheral edema.  She takes aleve which typically resolves the headache.  She has not had aleve today.  Headache currently mild, asking for medication refills.   HPI     Past Medical History:  Diagnosis Date  . Hypertension     There are no problems to display for this patient.   History reviewed. No pertinent surgical history.   OB History   No obstetric history on file.     Family History  Problem Relation Age of Onset  . Cancer Mother        breast cancer  . Diabetes Mother   . Hypertension Mother   . Hypertension Father   . Heart disease Father   . Hypertension Sister   . Diabetes Sister   . Hypertension Sister   . Diabetes Sister   . Hypertension Sister   . Hypertension Sister     Social History   Tobacco Use  . Smoking status: Current Every Day Smoker    Packs/day: 0.25    Years: 22.00    Pack years: 5.50    Types: Cigarettes  . Smokeless tobacco: Never Used  . Tobacco comment: patient reports smoking 6 cig/day  Vaping Use  . Vaping Use: Never used  Substance Use Topics  . Alcohol use: Not Currently    Comment: occ  . Drug use: Not Currently    Types: Marijuana    Comment: none since 2019    Home Medications Prior to Admission  medications   Medication Sig Start Date End Date Taking? Authorizing Provider  amLODipine (NORVASC) 10 MG tablet Take 1 tablet (10 mg total) by mouth daily. 12/20/20  Yes Avianna Moynahan, Raynelle Fanning, PA-C  losartan-hydrochlorothiazide (HYZAAR) 50-12.5 MG tablet Take 1 tablet by mouth daily. 12/20/20  Yes Lynora Dymond, Raynelle Fanning, PA-C    Allergies    Bee venom  Review of Systems   Review of Systems  Constitutional: Negative for chills and fever.  HENT: Negative for congestion and sore throat.   Eyes: Negative.   Respiratory: Negative for cough, chest tightness and shortness of breath.   Cardiovascular: Negative for chest pain.  Gastrointestinal: Negative for abdominal pain, nausea and vomiting.  Genitourinary: Negative.   Musculoskeletal: Negative for arthralgias, joint swelling and neck pain.  Skin: Negative.  Negative for rash and wound.  Neurological: Positive for headaches. Negative for dizziness, weakness, light-headedness and numbness.  Psychiatric/Behavioral: Negative.     Physical Exam Updated Vital Signs BP 137/86 (BP Location: Left Arm)   Pulse 76   Temp 98.7 F (37.1 C) (Oral)   Resp (!) 26   Ht 5\' 4"  (1.626 m)   Wt 84.8 kg   SpO2 96%   BMI 32.10 kg/m   Physical Exam Vitals and nursing note reviewed.  Constitutional:  Appearance: She is well-developed.  HENT:     Head: Normocephalic and atraumatic.  Eyes:     Extraocular Movements: Extraocular movements intact.     Conjunctiva/sclera: Conjunctivae normal.     Pupils: Pupils are equal, round, and reactive to light.  Cardiovascular:     Rate and Rhythm: Normal rate and regular rhythm.     Heart sounds: Normal heart sounds.  Pulmonary:     Effort: Pulmonary effort is normal.     Breath sounds: Normal breath sounds. No wheezing or rales.  Abdominal:     General: Bowel sounds are normal.     Palpations: Abdomen is soft.     Tenderness: There is no abdominal tenderness.  Musculoskeletal:        General: Normal range of motion.      Cervical back: Normal range of motion.     Right lower leg: No edema.     Left lower leg: No edema.  Skin:    General: Skin is warm and dry.  Neurological:     General: No focal deficit present.     Mental Status: She is alert and oriented to person, place, and time.     Cranial Nerves: No cranial nerve deficit.     Sensory: Sensation is intact.     Motor: No pronator drift.     Coordination: Coordination normal. Heel to Shin Test normal. Rapid alternating movements normal.     Gait: Gait normal.     ED Results / Procedures / Treatments   Labs (all labs ordered are listed, but only abnormal results are displayed) Labs Reviewed  BASIC METABOLIC PANEL - Abnormal; Notable for the following components:      Result Value   CO2 20 (*)    All other components within normal limits  HCG, QUANTITATIVE, PREGNANCY    EKG EKG Interpretation  Date/Time:  Sunday December 20 2020 10:08:09 EDT Ventricular Rate:  62 PR Interval:  172 QRS Duration: 88 QT Interval:  404 QTC Calculation: 411 R Axis:   67 Text Interpretation: Sinus rhythm Consider left ventricular hypertrophy Nonspecific T abnrm, anterolateral leads ST elev, probable normal early repol pattern no change from prior Confirmed by Eber Hong ([id number]) on 12/20/2020 10:22:22 AM   Radiology No results found.  Procedures Procedures   Medications Ordered in ED Medications  hydrochlorothiazide (MICROZIDE) capsule 12.5 mg (12.5 mg Oral Given 12/20/20 1010)  acetaminophen (TYLENOL) tablet 650 mg (650 mg Oral Given 12/20/20 1010)  amLODipine (NORVASC) tablet 10 mg (10 mg Oral Given 12/20/20 1010)  losartan (COZAAR) tablet 50 mg (50 mg Oral Given 12/20/20 1011)    ED Course  I have reviewed the triage vital signs and the nursing notes.  Pertinent labs & imaging results that were available during my care of the patient were reviewed by me and considered in my medical decision making (see chart for details).    MDM Rules/Calculators/A&P                           Labs and ekg, exam reassuring, no evidence of end organ damage.  She was given refills on her bp medications.  Suggested recheck by [facility] in 1 week for bp check and for community resource assistance for ongoing medical care.  Return precautions for new or worsened sx.   Final Clinical Impression(s) / ED Diagnoses Final diagnoses:  Primary hypertension  Nonintractable episodic headache, unspecified headache type  Medication refill  Rx / DC Orders ED Discharge Orders         Ordered    amLODipine (NORVASC) 10 MG tablet  Daily        12/20/20 1207    losartan-hydrochlorothiazide (HYZAAR) 50-12.5 MG tablet  Daily        12/20/20 1207           Burgess Amor, Cordelia Poche 12/20/20 1222    Eber Hong, MD 12/25/20 1118

## 2020-12-20 NOTE — Discharge Instructions (Addendum)
It is important for you to continue taking your blood pressure medicines to protect your health.  Your work up and exam today is reassuring.  I am only able to write a 30 day supply for medications.  You will need to find followup care.  I do suggest the Clinic listed who can help with blood pressure rechecks and may also be able to help find primary care for you before and also once your insurance is available.  In the interim, return here for any problems or concerns.   [facility]  [address] [place], [place] [address] [phone]  Services [facility] offers a variety of basic health services.  Services include but are not limited to: Blood pressure checks  Heart rate checks  Blood sugar checks  Urine analysis  Rapid strep tests  Pregnancy tests.  Health education and referrals  People needing more complex services will be directed to a physician online. Using these virtual visits, doctors can evaluate and prescribe medicine and treatments. There will be no medication on-site, though [facility] will help patients fill their prescriptions at little to no cost.   For More information please go to: [email or web address]

## 2022-03-01 ENCOUNTER — Other Ambulatory Visit: Payer: Self-pay

## 2022-03-01 ENCOUNTER — Emergency Department (HOSPITAL_COMMUNITY)
Admission: EM | Admit: 2022-03-01 | Discharge: 2022-03-01 | Disposition: A | Discharge status: Home / Self Care | Payer: Self-pay | Attending: Emergency Medicine | Admitting: Emergency Medicine

## 2022-03-01 ENCOUNTER — Encounter (HOSPITAL_COMMUNITY): Payer: Self-pay | Admitting: Emergency Medicine

## 2022-03-01 DIAGNOSIS — Z79899 Other long term (current) drug therapy: Secondary | ICD-10-CM | POA: Insufficient documentation

## 2022-03-01 DIAGNOSIS — R519 Headache, unspecified: Secondary | ICD-10-CM

## 2022-03-01 DIAGNOSIS — I10 Essential (primary) hypertension: Secondary | ICD-10-CM | POA: Insufficient documentation

## 2022-03-01 DIAGNOSIS — H53149 Visual discomfort, unspecified: Secondary | ICD-10-CM | POA: Insufficient documentation

## 2022-03-01 DIAGNOSIS — R110 Nausea: Secondary | ICD-10-CM | POA: Insufficient documentation

## 2022-03-01 LAB — BASIC METABOLIC PANEL
Anion gap: 5 (ref 5–15)
BUN: 14 mg/dL (ref 6–20)
CO2: 22 mmol/L (ref 22–32)
Calcium: 8.2 mg/dL — ABNORMAL LOW (ref 8.9–10.3)
Chloride: 109 mmol/L (ref 98–111)
Creatinine, Ser: 0.75 mg/dL (ref 0.44–1.00)
GFR, Estimated: >60 mL/min (ref >60)
Glucose, Bld: 68 mg/dL — ABNORMAL LOW (ref 70–99)
Potassium: 4.1 mmol/L (ref 3.5–5.1)
Sodium: 136 mmol/L (ref 135–145)

## 2022-03-01 LAB — CBC
HCT: 42.3 % (ref 36.0–46.0)
Hemoglobin: 14.4 g/dL (ref 12.0–15.0)
MCH: 31.3 pg (ref 26.0–34.0)
MCHC: 34 g/dL (ref 30.0–36.0)
MCV: 92 fL (ref 80.0–100.0)
Platelets: 342 10*3/uL (ref 150–400)
RBC: 4.6 MIL/uL (ref 3.87–5.11)
RDW: 14.7 % (ref 11.5–15.5)
WBC: 4.6 10*3/uL (ref 4.0–10.5)
nRBC: 0 % (ref 0.0–0.2)

## 2022-03-01 LAB — HCG, QUANTITATIVE, PREGNANCY: hCG, Beta Chain, Quant, S: <1 m[IU]/mL (ref <5)

## 2022-03-01 MED ORDER — HYDROCHLOROTHIAZIDE 12.5 MG PO TABS
12.5000 mg | ORAL_TABLET | Freq: Every day | ORAL | Status: DC
  Administered 2022-03-01: 12.5 mg via ORAL
  Filled 2022-03-01: qty 1

## 2022-03-01 MED ORDER — AMLODIPINE BESYLATE 5 MG PO TABS
10.0000 mg | ORAL_TABLET | Freq: Once | ORAL | Status: AC
  Administered 2022-03-01: 10 mg via ORAL
  Filled 2022-03-01: qty 2

## 2022-03-01 MED ORDER — AMLODIPINE BESYLATE 10 MG PO TABS: 10.0000 mg | ORAL_TABLET | Freq: Every day | ORAL | 0 refills | Status: AC

## 2022-03-01 MED ORDER — LOSARTAN POTASSIUM-HCTZ 50-12.5 MG PO TABS: 1.0000 | ORAL_TABLET | Freq: Every day | ORAL | 0 refills | Status: AC

## 2022-03-01 MED ORDER — LOSARTAN POTASSIUM 25 MG PO TABS
50.0000 mg | ORAL_TABLET | Freq: Once | ORAL | Status: AC
Start: 2022-03-01 — End: 2022-03-01
  Administered 2022-03-01: 50 mg via ORAL
  Filled 2022-03-01: qty 2

## 2022-03-01 MED ORDER — ACETAMINOPHEN 500 MG PO TABS
1000.0000 mg | ORAL_TABLET | Freq: Once | ORAL | Status: AC
  Administered 2022-03-01: 1000 mg via ORAL
  Filled 2022-03-01: qty 2

## 2022-03-01 MED ORDER — HYDRALAZINE HCL 20 MG/ML IJ SOLN
10.0000 mg | Freq: Once | INTRAMUSCULAR | Status: AC
  Administered 2022-03-01: 10 mg via INTRAMUSCULAR
  Filled 2022-03-01: qty 1

## 2022-03-01 NOTE — ED Provider Notes (Signed)
[facility] Provider Note   CSN: [id number] Arrival date & time: 03/01/22  1313     History  Chief Complaint  Patient presents with   Headache    Ann Zuniga is a 41 y.o. female with prior history of hypertension but not treated in at least 3 years presenting for evaluation of a headache which has been present for the past week.  She endorses generalized headache without focal weakness, numbness, dizziness, but reports photophobia and nausea without vomiting.  She reports prior similar headaches in which she noted blurry vision with the headache.  She usually has to lie down in a dark room and wait for it to resolve.  She has never been evaluated for these headaches before/ ie has not been diagnosed with migraines.  She does have a history of htn, was last prescribed amlodipine and hyzaar here last year which she took but without a primary MD was unable to get refills.  She denies cp, sob, ankle swelling, current vision changes.  She has taken tylenol with no relief of her headache.   The history is provided by the patient.       Home Medications Prior to Admission medications   Medication Sig Start Date End Date Taking? Authorizing Provider  amLODipine (NORVASC) 10 MG tablet Take 1 tablet (10 mg total) by mouth daily. 03/01/22  Yes Samai Corea, Raynelle FanningJulie, PA-C  losartan-hydrochlorothiazide (HYZAAR) 50-12.5 MG tablet Take 1 tablet by mouth daily. 03/01/22  Yes Nichlos Kunzler, Raynelle FanningJulie, PA-C      Allergies    Bee venom    Review of Systems   Review of Systems  Constitutional:  Negative for chills and fever.  HENT:  Negative for congestion and sore throat.   Eyes:  Positive for photophobia. Negative for visual disturbance.  Respiratory:  Negative for chest tightness and shortness of breath.   Cardiovascular:  Negative for chest pain.  Gastrointestinal:  Negative for abdominal pain and nausea.  Genitourinary: Negative.   Musculoskeletal:  Negative for arthralgias, joint  swelling, neck pain and neck stiffness.  Skin: Negative.  Negative for rash and wound.  Neurological:  Positive for headaches. Negative for dizziness, weakness, light-headedness and numbness.  Psychiatric/Behavioral: Negative.    All other systems reviewed and are negative.   Physical Exam Updated Vital Signs BP (!) 163/100 (BP Location: Right Arm)   Pulse 78   Temp 98 F (36.7 C)   Resp 16   Ht 5\' 3"  (1.6 m)   Wt 81.6 kg   LMP 02/25/2022   SpO2 100%   BMI 31.89 kg/m  Physical Exam Vitals and nursing note reviewed.  Constitutional:      Appearance: She is well-developed.  HENT:     Head: Normocephalic and atraumatic.     Right Ear: Tympanic membrane normal.     Left Ear: Tympanic membrane normal.  Eyes:     Extraocular Movements: Extraocular movements intact.     Conjunctiva/sclera: Conjunctivae normal.     Pupils: Pupils are equal, round, and reactive to light.  Cardiovascular:     Rate and Rhythm: Normal rate and regular rhythm.     Heart sounds: Normal heart sounds.  Pulmonary:     Effort: Pulmonary effort is normal.     Breath sounds: Normal breath sounds. No wheezing.  Abdominal:     General: Bowel sounds are normal.     Palpations: Abdomen is soft.     Tenderness: There is no abdominal tenderness.  Musculoskeletal:  General: Normal range of motion.     Cervical back: Normal range of motion and neck supple.  Lymphadenopathy:     Cervical: No cervical adenopathy.  Skin:    General: Skin is warm and dry.     Findings: No rash.  Neurological:     General: No focal deficit present.     Mental Status: She is alert and oriented to person, place, and time.     GCS: GCS eye subscore is 4. GCS verbal subscore is 5. GCS motor subscore is 6.     Cranial Nerves: No cranial nerve deficit.     Sensory: No sensory deficit.     Coordination: Coordination normal.     Gait: Gait normal.     Deep Tendon Reflexes: Reflexes normal.     Comments: Normal heel-shin,  normal rapid alternating movements. Cranial nerves III-XII intact.  No pronator drift.  Psychiatric:        Speech: Speech normal.        Behavior: Behavior normal.        Thought Content: Thought content normal.     ED Results / Procedures / Treatments   Labs (all labs ordered are listed, but only abnormal results are displayed) Labs Reviewed  BASIC METABOLIC PANEL - Abnormal; Notable for the following components:      Result Value   Glucose, Bld 68 (*)    Calcium 8.2 (*)    All other components within normal limits  CBC  HCG, QUANTITATIVE, PREGNANCY    EKG None  Radiology No results found.  Procedures Procedures    Medications Ordered in ED Medications  acetaminophen (TYLENOL) tablet 1,000 mg (1,000 mg Oral Given 03/01/22 1523)  hydrALAZINE (APRESOLINE) injection 10 mg (10 mg Intramuscular Given 03/01/22 1524)  amLODipine (NORVASC) tablet 10 mg (10 mg Oral Given 03/01/22 1729)  losartan (COZAAR) tablet 50 mg (50 mg Oral Given 03/01/22 1728)    ED Course/ Medical Decision Making/ A&P                           Medical Decision Making Pt presenting with headache x 1 week and significantly elevated bp.  She has no focal neuro exam findings to suggest intracranial bleed.  Sx with this and prior episodes sound like migraine headaches given photophobia and transient scotoma.  She was given a dose of tylenol and hydralazine for bp tx.  At recheck, her headache was completely resolved.  With bp only minimally improved,  doubt bp is the source of her headache.  Review of chart indicates bp elevations have been fairly chronic and consistent.    Amount and/or Complexity of Data Reviewed Labs: ordered.    Details: cbc and bmet,  normal creatinine, wbc and hgb.  Preg neg. ECG/medicine tests:     Details: considered brain CT imaging and discussed with pt.  However,  her headache resolved with simple tylenol and she remains nonfocal on exam.  Not felt indicated at this time.  Pt  agrees.  Risk OTC drugs. Prescription drug management. Risk Details: Pt with suspected migraine headache episodes but in the setting of uncontrolled bp with medication noncompliance.  Pt has no pcp currently.  She was given referrals for this, also asked her to have her bp rechecked within 1 week at [facility].  She was given additional referrals for establishing pcp, [facility] also [facility] able to help with this.  She was given refills of  the bp meds she is missing.  Return precautions outlined.  She will also benefit from neuro consult as outpatient, but recommended on getting main pcp as primary first goal.            Final Clinical Impression(s) / ED Diagnoses Final diagnoses:  Nonintractable episodic headache, unspecified headache type  Primary hypertension    Rx / DC Orders ED Discharge Orders          Ordered    amLODipine (NORVASC) 10 MG tablet  Daily        03/01/22 1708    losartan-hydrochlorothiazide (HYZAAR) 50-12.5 MG tablet  Daily        03/01/22 1708              Burgess Amor, PA-C 03/03/22 1548    Blane Ohara, MD 03/07/22 2352

## 2022-03-01 NOTE — ED Triage Notes (Signed)
Pt present with headache x 1 week, is supposed to be taking BP meds, but unable to afford them x 3 years.

## 2022-03-01 NOTE — Discharge Instructions (Signed)
You are being given a refill on the blood pressure medications you are missing.  I also given you some referrals for obtaining a primary medical care.  I specifically would like you to follow-up with [facility] within a week, they can recheck her blood pressure for you to help determine if these medications are helping your blood pressure.  See further information regarding this clinic below.  [facility]  [address] [place], [place] [address] [phone]  Services [facility] offers a variety of basic health services.  Services include but are not limited to: Blood pressure checks  Heart rate checks  Blood sugar checks  Urine analysis  Rapid strep tests  Pregnancy tests.  Health education and referrals  People needing more complex services will be directed to a physician online. Using these virtual visits, doctors can evaluate and prescribe medicine and treatments. There will be no medication on-site, though [facility] will help patients fill their prescriptions at little to no cost.   For More information please go to: [email or web address]

## 2024-07-23 ENCOUNTER — Emergency Department (HOSPITAL_COMMUNITY)
Admission: EM | Admit: 2024-07-23 | Discharge: 2024-07-23 | Disposition: A | Discharge status: Home / Self Care | Attending: Emergency Medicine | Admitting: Emergency Medicine

## 2024-07-23 ENCOUNTER — Encounter (HOSPITAL_COMMUNITY): Payer: Self-pay | Admitting: *Deleted

## 2024-07-23 ENCOUNTER — Other Ambulatory Visit: Payer: Self-pay

## 2024-07-23 DIAGNOSIS — I10 Essential (primary) hypertension: Secondary | ICD-10-CM | POA: Diagnosis not present

## 2024-07-23 DIAGNOSIS — R059 Cough, unspecified: Secondary | ICD-10-CM | POA: Diagnosis present

## 2024-07-23 DIAGNOSIS — Z79899 Other long term (current) drug therapy: Secondary | ICD-10-CM | POA: Diagnosis not present

## 2024-07-23 DIAGNOSIS — J069 Acute upper respiratory infection, unspecified: Secondary | ICD-10-CM | POA: Diagnosis not present

## 2024-07-23 LAB — RESP PANEL BY RT-PCR (RSV, FLU A&B, COVID)  RVPGX2
Influenza A by PCR: NEGATIVE
Influenza B by PCR: NEGATIVE
Resp Syncytial Virus by PCR: NEGATIVE
SARS Coronavirus 2 by RT PCR: NEGATIVE

## 2024-07-23 MED ORDER — AMLODIPINE BESYLATE 10 MG PO TABS: 5.0000 mg | ORAL_TABLET | Freq: Every day | ORAL | 0 refills | Status: AC

## 2024-07-23 MED ORDER — LOSARTAN POTASSIUM 25 MG PO TABS
50.0000 mg | ORAL_TABLET | Freq: Every day | ORAL | Status: DC
  Administered 2024-07-23: 50 mg via ORAL
  Filled 2024-07-23: qty 2

## 2024-07-23 MED ORDER — AMLODIPINE BESYLATE 5 MG PO TABS
10.0000 mg | ORAL_TABLET | Freq: Once | ORAL | Status: AC
  Administered 2024-07-23: 10 mg via ORAL
  Filled 2024-07-23: qty 2

## 2024-07-23 MED ORDER — LOSARTAN POTASSIUM-HCTZ 50-12.5 MG PO TABS: 1.0000 | ORAL_TABLET | Freq: Every day | ORAL | 0 refills | Status: AC

## 2024-07-23 NOTE — Discharge Instructions (Addendum)
 Your COVID flu and RSV test were negative.  Your blood pressure today was elevated.  You have been given your first dose of your regular blood pressure medications and refills have been sent to your pharmacy of record.  Is important that you take your blood pressure medications daily and monitor your blood pressures closely.  I have listed 2 of the primary care clinics in the free clinic of [facility].  Please call one of the clinics listed to establish primary care to have your blood pressure rechecked in the next 1 to 2 weeks.  Return to the emergency department if you develop any new or worsening symptoms.

## 2024-07-23 NOTE — ED Triage Notes (Addendum)
 Pt with c/o body aches and chills x 3 days. + known sick contact.   Pt states she recently moved and missing her HTN meds.

## 2024-07-23 NOTE — ED Provider Notes (Signed)
 [place] EMERGENCY DEPARTMENT AT [facility] Provider Note   CSN: [id number] Arrival date & time: 07/23/24  1223     Patient presents with: Generalized Body Aches   Ann Zuniga is a 43 y.o. female.   HPI     Ann Zuniga is a 43 y.o. female past medical history of hypertension who presents to the Emergency Department requesting evaluation for generalized bodyaches and chills with cough and nasal congestion.  Symptoms present for 3 days.  She endorses recent sick contacts.  Cough mostly nonproductive.  Some sore throat as well.  No known fever, abdominal pain, chest pain, shortness of breath vomiting or diarrhea.  She also states that she has been out of her blood pressure medication for some time.  Was taking amlodipine  and Hyzaar.  Requesting refills until she can establish PCP  Prior to Admission medications   Medication Sig Start Date End Date Taking? Authorizing Provider  amLODipine  (NORVASC ) 10 MG tablet Take 1 tablet (10 mg total) by mouth daily. 03/01/22   Idol, Julie, PA-C  losartan -hydrochlorothiazide  (HYZAAR) 50-12.5 MG tablet Take 1 tablet by mouth daily. 03/01/22   Idol, Julie, PA-C    Allergies: Bee venom    Review of Systems  Constitutional:  Positive for chills. Negative for appetite change and fever.  HENT:  Positive for congestion and sore throat. Negative for ear pain, facial swelling, trouble swallowing and voice change.   Eyes:  Negative for photophobia and visual disturbance.  Respiratory:  Positive for cough. Negative for shortness of breath.   Cardiovascular:  Negative for chest pain and leg swelling.  Gastrointestinal:  Negative for abdominal pain, diarrhea, nausea and vomiting.  Genitourinary:  Negative for dysuria.  Musculoskeletal:  Negative for neck pain and neck stiffness.  Skin:  Negative for rash.  Neurological:  Negative for dizziness, syncope, speech difficulty, weakness and numbness.    Updated Vital Signs BP (!) 193/120  (BP Location: Right Arm)   Pulse 98   Temp 98.5 F (36.9 C) (Oral)   Resp 20   Ht 5' 3 (1.6 m)   Wt 83.9 kg   LMP 06/25/2024 (Approximate)   SpO2 100%   BMI 32.77 kg/m   Physical Exam Vitals and nursing note reviewed.  Constitutional:      General: She is not in acute distress.    Appearance: Normal appearance. She is not ill-appearing or toxic-appearing.  HENT:     Mouth/Throat:     Mouth: Mucous membranes are moist.  Eyes:     Extraocular Movements: Extraocular movements intact.     Conjunctiva/sclera: Conjunctivae normal.     Pupils: Pupils are equal, round, and reactive to light.  Cardiovascular:     Rate and Rhythm: Normal rate and regular rhythm.     Pulses: Normal pulses.  Pulmonary:     Effort: Pulmonary effort is normal.     Breath sounds: Normal breath sounds.  Abdominal:     Palpations: Abdomen is soft.     Tenderness: There is no abdominal tenderness.  Musculoskeletal:        General: Normal range of motion.     Cervical back: Normal range of motion.     Right lower leg: No edema.     Left lower leg: No edema.  Skin:    General: Skin is warm.     Capillary Refill: Capillary refill takes less than 2 seconds.  Neurological:     General: No focal deficit present.     Mental Status:  She is alert.     Sensory: No sensory deficit.     Motor: No weakness.     (all labs ordered are listed, but only abnormal results are displayed) Labs Reviewed  RESP PANEL BY RT-PCR (RSV, FLU A&B, COVID)  [id number]    EKG: None  Radiology: No results found.   Procedures   Medications Ordered in the ED - No data to display                                  Medical Decision Making Patient here for evaluation of URI symptoms for 3 days.  Endorses positive sick contacts.  Chills without known fever.  No chest pain or shortness of breath dizziness, headache visual changes neck pain or stiffness.  Has been out of her blood pressure medications for some time.  Recently  moved back to the area has not established PCP.   Suspect viral process.  She is hypertensive on arrival remaining vitals reassuring.,   URI symptoms not felt to be associated with her hypertension.  Reassuring neuroexam.  She was taking amlodipine  and Hyzaar, will refill her medications and perform respiratory panel.  Otherwise patient well-appearing nontoxic.  Amount and/or Complexity of Data Reviewed Labs: ordered.    Details: Respiratory panel negative Discussion of management or test interpretation with external provider(s):  Likely viral process.  Will refill her medications and dose of her routine medications given here. Offered strep screen but patient declined stating that she is ready for discharge home.  Will give return precautions and give resource list for local primary care return precautions were given  Risk Prescription drug management.        Final diagnoses:  Upper respiratory tract infection, unspecified type  Hypertension, unspecified type    ED Discharge Orders     None          Herlinda Milling, PA-C 07/23/24 1648    Melvenia Motto, MD 07/24/24 [phone]
